# Patient Record
Sex: Female | Born: 2000 | Race: Black or African American | Hispanic: No | Marital: Single | Smoking: Never smoker
Health system: Southern US, Community
[De-identification: ages and names within clinical notes are randomized; demographics above are authoritative.]

## PROBLEM LIST (undated history)

## (undated) DIAGNOSIS — Z9109 Other allergy status, other than to drugs and biological substances: Secondary | ICD-10-CM

## (undated) DIAGNOSIS — T7840XA Allergy, unspecified, initial encounter: Secondary | ICD-10-CM

## (undated) HISTORY — DX: Allergy, unspecified, initial encounter: T78.40XA

---
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
## 2021-01-26 ENCOUNTER — Encounter (HOSPITAL_COMMUNITY): Payer: Self-pay | Admitting: Emergency Medicine

## 2021-01-26 ENCOUNTER — Other Ambulatory Visit: Payer: Self-pay

## 2021-01-26 ENCOUNTER — Emergency Department (HOSPITAL_COMMUNITY)
Admission: EM | Admit: 2021-01-26 | Discharge: 2021-01-26 | Disposition: A | Discharge status: Home / Self Care | Payer: Medicaid Other | Attending: Emergency Medicine | Admitting: Emergency Medicine

## 2021-01-26 DIAGNOSIS — S21102A Unspecified open wound of left front wall of thorax without penetration into thoracic cavity, initial encounter: Secondary | ICD-10-CM | POA: Diagnosis not present

## 2021-01-26 DIAGNOSIS — R030 Elevated blood-pressure reading, without diagnosis of hypertension: Secondary | ICD-10-CM | POA: Diagnosis not present

## 2021-01-26 DIAGNOSIS — X58XXXA Exposure to other specified factors, initial encounter: Secondary | ICD-10-CM | POA: Diagnosis not present

## 2021-01-26 DIAGNOSIS — R059 Cough, unspecified: Secondary | ICD-10-CM | POA: Insufficient documentation

## 2021-01-26 DIAGNOSIS — S299XXA Unspecified injury of thorax, initial encounter: Secondary | ICD-10-CM | POA: Diagnosis present

## 2021-01-26 DIAGNOSIS — R509 Fever, unspecified: Secondary | ICD-10-CM | POA: Diagnosis not present

## 2021-01-26 DIAGNOSIS — Z20822 Contact with and (suspected) exposure to covid-19: Secondary | ICD-10-CM | POA: Diagnosis not present

## 2021-01-26 DIAGNOSIS — Z8616 Personal history of COVID-19: Secondary | ICD-10-CM | POA: Insufficient documentation

## 2021-01-26 DIAGNOSIS — Z7722 Contact with and (suspected) exposure to environmental tobacco smoke (acute) (chronic): Secondary | ICD-10-CM | POA: Diagnosis not present

## 2021-01-26 LAB — BASIC METABOLIC PANEL
Anion gap: 6 (ref 5–15)
BUN: 7 mg/dL (ref 6–20)
CO2: 28 mmol/L (ref 22–32)
Calcium: 9.3 mg/dL (ref 8.9–10.3)
Chloride: 101 mmol/L (ref 98–111)
Creatinine, Ser: 0.78 mg/dL (ref 0.44–1.00)
GFR, Estimated: >60 mL/min (ref >60)
Glucose, Bld: 89 mg/dL (ref 70–99)
Potassium: 4 mmol/L (ref 3.5–5.1)
Sodium: 135 mmol/L (ref 135–145)

## 2021-01-26 LAB — CBC WITH DIFFERENTIAL/PLATELET
Abs Immature Granulocytes: 0.04 10*3/uL (ref 0.00–0.07)
Basophils Absolute: 0.1 10*3/uL (ref 0.0–0.1)
Basophils Relative: 1 %
Eosinophils Absolute: 0.3 10*3/uL (ref 0.0–0.5)
Eosinophils Relative: 2 %
HCT: 48.6 % — ABNORMAL HIGH (ref 36.0–46.0)
Hemoglobin: 14.8 g/dL (ref 12.0–15.0)
Immature Granulocytes: 0 %
Lymphocytes Relative: 27 %
Lymphs Abs: 3 10*3/uL (ref 0.7–4.0)
MCH: 24 pg — ABNORMAL LOW (ref 26.0–34.0)
MCHC: 30.5 g/dL (ref 30.0–36.0)
MCV: 78.8 fL — ABNORMAL LOW (ref 80.0–100.0)
Monocytes Absolute: 0.8 10*3/uL (ref 0.1–1.0)
Monocytes Relative: 7 %
Neutro Abs: 7.2 10*3/uL (ref 1.7–7.7)
Neutrophils Relative %: 63 %
Platelets: 380 10*3/uL (ref 150–400)
RBC: 6.17 MIL/uL — ABNORMAL HIGH (ref 3.87–5.11)
RDW: 16.3 % — ABNORMAL HIGH (ref 11.5–15.5)
WBC: 11.4 10*3/uL — ABNORMAL HIGH (ref 4.0–10.5)
nRBC: 0 % (ref 0.0–0.2)

## 2021-01-26 LAB — SARS CORONAVIRUS 2 (TAT 6-24 HRS): SARS Coronavirus 2: NEGATIVE

## 2021-01-26 MED ORDER — CEPHALEXIN 500 MG PO CAPS: 500.0000 mg | ORAL_CAPSULE | Freq: Four times a day (QID) | ORAL | 0 refills | Status: AC

## 2021-01-26 NOTE — ED Provider Notes (Signed)
[facility] EMERGENCY DEPARTMENT Provider Note   CSN: [id number] Arrival date & time: 01/26/21  1151     History Chief Complaint  Patient presents with   wound   Fever    Victoria Roy is a 20 y.o. adult with a past medical history of elevated BMI, prediabetes, hidradenitis suppurativa, who presents today for evaluation of a wound on the left side. They state that they noted a wound about 3 to 4 weeks ago on their left-sided chest.  They were seen in the emergency room on the second of this month and they were given a prescription for doxycycline which they state that they completed without any significant change. They do note that they went swimming during this in a chlorinated pool. They stated that they put grease on the area.  When I asked him what kind degrees they tell me it was hair grease.  The feel like the area is more painful and swollen than usual.   They also note that they had a known COVID exposure on Monday however they had been feeling poorly since since Sunday.  They deny any temps over 100.4 at home.  They have been coughing.  They are vaccinated against COVID and had COVID earlier this year.  They deny significant shortness of breath or chest pain aside from the wound.  They deny any abdominal pain, leg swelling, or rash.  When they were seen on 9/2 their blood pressure was noted to be significantly elevated in the 190s systolic.  They have not gotten any follow-up for this.  He states that at home on the check their blood pressure it is normal.   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 No pertinent surgical history.   OB History   No obstetric history on file.     Family History  Problem Relation Age of Onset   Heart attack Mother    Heart attack Maternal Grandmother    Breast cancer Maternal Grandmother     Social History   Tobacco Use   Smoking status: Never    Passive exposure: Yes   Smokeless tobacco: Never   Tobacco comments:    grandfather smokes outside  Vaping Use   Vaping Use: Every day  Substance Use Topics   Alcohol use: No   Drug use: Never    Home Medications Prior to Admission medications   Medication Sig Start Date End Date Taking? Authorizing Provider  cephALEXin (KEFLEX) 500 MG capsule Take 1 capsule (500 mg total) by mouth 4 (four) times daily for 10 days. 01/26/21 02/05/21 Yes Cristina Gong, PA-C    Allergies    Pollen extract and Soap  Review of Systems   Review of Systems  Constitutional:  Negative for chills and fever.  HENT:  Positive for congestion.   Respiratory:  Positive for cough. Negative for chest tightness.   Cardiovascular:  Negative for chest pain, palpitations and leg swelling.  Gastrointestinal:  Negative for abdominal pain, nausea and vomiting.  Musculoskeletal:  Negative for back pain and neck pain.  Skin:  Positive for wound.  Neurological:  Negative for dizziness, weakness and headaches.  Psychiatric/Behavioral:  Negative for confusion.   All other systems reviewed and are negative.  Physical Exam Updated Vital Signs BP (!) 151/94   Pulse 97   Temp 97.7 F (36.5 C)   Resp 18   SpO2 97%   Physical Exam Vitals and nursing note reviewed.  Constitutional:      General: Victoria Roy is not in acute distress.    Appearance: Victoria Roy is not ill-appearing.  HENT:     Head: Atraumatic.  Eyes:     Conjunctiva/sclera: Conjunctivae normal.  Cardiovascular:     Rate and Rhythm:  Normal rate.  Pulmonary:     Effort: Pulmonary effort is normal. No respiratory distress.  Abdominal:     General: There is no distension.  Musculoskeletal:     Cervical back: Normal range of motion and neck supple.     Comments: No obvious acute injury  Skin:    General: Skin is warm.     Comments: Please see clinical image.  On the left lateral chest wall at the base of pannus there is a small, approximately 1 cm wound.  There is no significant surrounding induration, fluctuance.  There is no surrounding crepitus with palpation.  Surrounding skin appears wet and macerated.  Neurological:     Mental Status: Victoria Roy is alert.     Comments: Awake and alert, answers all questions appropriately.  Speech is not slurred.  Psychiatric:        Mood and Affect: Mood normal.        Behavior: Behavior normal.     ED Results / Procedures / Treatments   Labs (all labs ordered are listed, but only abnormal results are displayed) Labs Reviewed  CBC WITH DIFFERENTIAL/PLATELET - Abnormal; Notable for the following components:      Result Value   WBC 11.4 (*)    RBC 6.17 (*)    HCT 48.6 (*)    MCV 78.8 (*)    MCH 24.0 (*)    RDW 16.3 (*)    All other components within normal limits  SARS CORONAVIRUS 2 (TAT 6-24 HRS)  BASIC METABOLIC PANEL    EKG None  Radiology No results found.  Procedures Procedures   Medications Ordered in ED Medications - No data to display  ED Course  I have reviewed the triage vital signs and the nursing notes.  Pertinent labs & imaging results that were available during my care of the patient were reviewed by me and considered in my medical decision making (see chart for details).    MDM Rules/Calculators/A&P                         Patient is an 20 year old adult who presents today for evaluation of a left-sided wound on the chest/abdomen.  There is no abnormal surrounding erythema, induration, or purulence.  They do report subjective  fevers however no measured fevers, and is afebrile here.  He did have a known COVID exposure which would explain the reported congestion, and cough.  They do not have any significant shortness of breath, chest pain, or abnormal leg swelling.  They report they are vaccinated against COVID.  Victoria Roy was evaluated in Emergency Department on 01/26/2021 for the symptoms described in the history of present illness. Victoria Roy was  evaluated in the context of the global COVID-19 pandemic, which necessitated consideration that the patient might be at risk for infection with the SARS-CoV-2 virus that causes COVID-19. Institutional protocols and algorithms that pertain to the evaluation of patients at risk for COVID-19 are in a state of rapid change based on information released by regulatory bodies including the CDC and federal and state organizations. These policies and algorithms were followed during the patient's care in the ED.   I suspect that given that they had a COVID exposure and now have COVID-like symptoms they are instructed that even if this test returns negative they need to quarantine and isolate appropriately.  Regarding their wound we discussed appropriate wound care.  They did swim in a pool with this open wound, and therefore we will give him Keflex for water exposure/Aeromonas coverage.  They do not appear to meet septic criteria at this time or have a significant infection.  Basic wound care is discussed.  Given that this wound has been present for multiple weeks referral for wound care is ordered.  They do have a history of prediabetes however their sugar is currently not significantly elevated.    Return precautions were discussed with patient who states their understanding.  At the time of discharge patient denied any unaddressed complaints or concerns.  Patient is agreeable for discharge home.  Note: Portions of this report may have been transcribed using voice  recognition software. Every effort was made to ensure accuracy; however, inadvertent computerized transcription errors may be present  Final Clinical Impression(s) / ED Diagnoses Final diagnoses:  Open wound of left chest wall, initial encounter  Suspected COVID-19 virus infection  Elevated blood pressure reading    Rx / DC Orders ED Discharge Orders          Ordered    Ambulatory referral to Wound Clinic        01/26/21 1728    cephALEXin (KEFLEX) 500 MG capsule  4 times daily        01/26/21 1748             Cristina Gong, PA-C 01/26/21 2341    Virgina Norfolk, DO 01/26/21 2343

## 2021-01-26 NOTE — Discharge Instructions (Addendum)
Given that you had a known COVID exposure and are now having symptoms even if your test today comes back negative you need to assume it is a false negative, that you have COVID, and quarantine appropriately. If you develop significant shortness of breath, severe chest pain, rashes, leg swelling, or have any other concerns please do not hesitate to seek additional medical care and evaluation. If your test is positive it appears that you would qualify for some of the oral antiviral medicines.  I have given you a prescription for antibiotics today.  This is specifically to treat any infection that you may have from the swimming.  Please do not swim, submerge the wound while bathing, as these things will make it infected. It is okay for you to shower, gently clean the area with gentle soap and allow water to run over the area rather than directly exposing it to the pressure from the shower. Please pat your wound dry.  If you are at home it is okay to leave the wound open and uncovered to allow it to air out.  Otherwise please keep it covered with the dressing.  If there is a lot of drainage then you may be better off taking a clean washcloth and putting it in the area and frequently changing that out to keep it from staying wet constantly.  I would recommend for the dressing that you, as we discussed, do a small square of the yellow gauze, followed by a patch of the nonstick gauze.  I have given you a referral to the wound clinic, please follow-up with them.  If you develop any new or concerning symptoms please seek additional medical care and evaluation.  As we discussed your blood pressure was significantly elevated today.  Please recheck this, preferably at home, in the next few weeks. Given that your blood pressure was high here I would recommend that if you need a cough or cold medicine you take those designed for someone with high blood pressure, such as Coricidin HBP.  You may have diarrhea  from the antibiotics.  It is very important that you continue to take the antibiotics even if you get diarrhea unless a medical professional tells you that you may stop taking them.  If you stop too early the bacteria you are being treated for will become stronger and you may need different, more powerful antibiotics that have more side effects and worsening diarrhea.  Please stay well hydrated and consider probiotics as they may decrease the severity of your diarrhea.  Please be aware that if you take any hormonal contraception (birth control pills, nexplanon, the ring, etc) that your birth control will not work while you are taking antibiotics and you need to use back up protection as directed on the birth control medication information insert.

## 2021-01-26 NOTE — ED Triage Notes (Signed)
Pt here for opened boil on L side of abdomen x 3 weeks, nasal drainage, fever, and non-productive cough x1 day. Pt say a PCP for boil and they prescribed doxycycline, pt finished antibiotics but boil is getting worse. Pt has swelling and open wound on abdomen.

## 2021-01-26 NOTE — ED Notes (Addendum)
Pt has two small wounds in the fold of left side mid abdomen. The biggest of the two wounds measures 0.05cmx0.08cm and the second 0.02cm The bedding is red tissue.

## 2021-01-26 NOTE — ED Provider Notes (Signed)
Emergency Medicine Provider Triage Evaluation Note  Victoria Roy , a 20 y.o. female  was evaluated in triage.  Pt complains of abscess underneath left breast that she noticed 3 to 4 weeks ago.  She reports she saw her PCP who started her on doxycycline.  She states she finished about 1 week ago without any improvement in symptoms.  She is also complaining of new onset cough and nasal congestion with positive recent sick contact with family member who is COVID-positive.  Patient is vaccinated.  States she has felt subjective fevers..  Review of Systems  Positive: + abscess, nasal congestion, cough, subjective fevers Negative: - SOB  Physical Exam  BP (!) 175/95 (BP Location: Left Arm)   Pulse 97   Temp 97.9 F (36.6 C) (Oral)   Resp 20   SpO2 98%  Gen:   Awake, no distress   Resp:  Normal effort  MSK:   Moves extremities without difficulty  Other:  Chaperone present for exam. Skin breakdown noted to left breast fold with serosanguinous drainage. No abscess appreciated at this time.   Medical Decision Making  Medically screening exam initiated at 1:27 PM.  Appropriate orders placed.  LEYAN BRANDEN was informed that the remainder of the evaluation will be completed by another provider, this initial triage assessment does not replace that evaluation, and the importance of remaining in the ED until their evaluation is complete.     Tanda Rockers, PA-C 01/26/21 1329    Tegeler, Canary Brim, MD 01/26/21 [phone]

## 2021-01-26 NOTE — ED Provider Notes (Signed)
I personally evaluated the patient during the encounter and completed a history, physical, procedures, medical decision making to contribute to the overall care of the patient and decision making for the patient briefly, the patient is a 20 y.o. adult here for reevaluation of left-sided wound/cellulitis.  Completed course antibiotics several days ago.  Patient has wound just under left breast tissue.  There is no obvious surrounding erythema or abscess or purulence.  There are some skin breakdown.  Overall there appears to be just some wound dehiscence from likely an old abscess site.  Some granulation tissue as well.  Have low suspicion for any deep space abscess.  Vital signs are unremarkable.  Lab work is unremarkable.  Mostly it sounds like patient has not been able to keep area clean and dry.  Has been using some topical ointments and going in pools that likely are not helping.  We will conservatively treat with another round antibiotics and topical Bactroban and given further wound care instructions.  Important to keep the area clean and dry as much as possible.  We will refer to wound clinic.  Discharged in good condition.  This chart was dictated using voice recognition software.  Despite best efforts to proofread,  errors can occur which can change the documentation meaning.    EKG Interpretation None            Virgina Norfolk, DO 01/26/21 1738

## 2021-01-31 ENCOUNTER — Encounter (HOSPITAL_BASED_OUTPATIENT_CLINIC_OR_DEPARTMENT_OTHER): Payer: Medicaid Other | Admitting: Internal Medicine

## 2021-02-14 ENCOUNTER — Encounter (HOSPITAL_BASED_OUTPATIENT_CLINIC_OR_DEPARTMENT_OTHER): Payer: Medicaid Other | Admitting: Internal Medicine

## 2021-07-27 ENCOUNTER — Encounter (HOSPITAL_BASED_OUTPATIENT_CLINIC_OR_DEPARTMENT_OTHER): Payer: Self-pay

## 2021-07-27 ENCOUNTER — Emergency Department (HOSPITAL_BASED_OUTPATIENT_CLINIC_OR_DEPARTMENT_OTHER): Payer: Medicaid Other

## 2021-07-27 ENCOUNTER — Other Ambulatory Visit: Payer: Self-pay

## 2021-07-27 ENCOUNTER — Emergency Department (HOSPITAL_BASED_OUTPATIENT_CLINIC_OR_DEPARTMENT_OTHER)
Admission: EM | Admit: 2021-07-27 | Discharge: 2021-07-27 | Disposition: A | Discharge status: Home / Self Care | Payer: Medicaid Other | Attending: Emergency Medicine | Admitting: Emergency Medicine

## 2021-07-27 DIAGNOSIS — H7091 Unspecified mastoiditis, right ear: Secondary | ICD-10-CM | POA: Insufficient documentation

## 2021-07-27 DIAGNOSIS — J029 Acute pharyngitis, unspecified: Secondary | ICD-10-CM | POA: Diagnosis present

## 2021-07-27 DIAGNOSIS — J020 Streptococcal pharyngitis: Secondary | ICD-10-CM | POA: Diagnosis not present

## 2021-07-27 LAB — CBC WITH DIFFERENTIAL/PLATELET
Abs Immature Granulocytes: 0.03 10*3/uL (ref 0.00–0.07)
Basophils Absolute: 0.1 10*3/uL (ref 0.0–0.1)
Basophils Relative: 1 %
Eosinophils Absolute: 0.4 10*3/uL (ref 0.0–0.5)
Eosinophils Relative: 4 %
HCT: 47.3 % — ABNORMAL HIGH (ref 36.0–46.0)
Hemoglobin: 14.7 g/dL (ref 12.0–15.0)
Immature Granulocytes: 0 %
Lymphocytes Relative: 11 %
Lymphs Abs: 1.2 10*3/uL (ref 0.7–4.0)
MCH: 23.9 pg — ABNORMAL LOW (ref 26.0–34.0)
MCHC: 31.1 g/dL (ref 30.0–36.0)
MCV: 76.8 fL — ABNORMAL LOW (ref 80.0–100.0)
Monocytes Absolute: 0.8 10*3/uL (ref 0.1–1.0)
Monocytes Relative: 8 %
Neutro Abs: 7.9 10*3/uL — ABNORMAL HIGH (ref 1.7–7.7)
Neutrophils Relative %: 76 %
Platelets: 330 10*3/uL (ref 150–400)
RBC: 6.16 MIL/uL — ABNORMAL HIGH (ref 3.87–5.11)
RDW: 17 % — ABNORMAL HIGH (ref 11.5–15.5)
WBC: 10.3 10*3/uL (ref 4.0–10.5)
nRBC: 0 % (ref 0.0–0.2)

## 2021-07-27 LAB — COMPREHENSIVE METABOLIC PANEL
ALT: 35 U/L (ref 0–44)
AST: 30 U/L (ref 15–41)
Albumin: 3.9 g/dL (ref 3.5–5.0)
Alkaline Phosphatase: 74 U/L (ref 38–126)
Anion gap: 6 (ref 5–15)
BUN: 6 mg/dL (ref 6–20)
CO2: 27 mmol/L (ref 22–32)
Calcium: 8.5 mg/dL — ABNORMAL LOW (ref 8.9–10.3)
Chloride: 100 mmol/L (ref 98–111)
Creatinine, Ser: 0.9 mg/dL (ref 0.44–1.00)
GFR, Estimated: >60 mL/min (ref >60)
Glucose, Bld: 92 mg/dL (ref 70–99)
Potassium: 3.5 mmol/L (ref 3.5–5.1)
Sodium: 133 mmol/L — ABNORMAL LOW (ref 135–145)
Total Bilirubin: 0.6 mg/dL (ref 0.3–1.2)
Total Protein: 8.1 g/dL (ref 6.5–8.1)

## 2021-07-27 LAB — GROUP A STREP BY PCR: Group A Strep by PCR: DETECTED — AB

## 2021-07-27 MED ORDER — AMOXICILLIN-POT CLAVULANATE 875-125 MG PO TABS: 1.0000 | ORAL_TABLET | Freq: Two times a day (BID) | ORAL | 0 refills | Status: AC

## 2021-07-27 MED ORDER — CLINDAMYCIN PHOSPHATE 600 MG/50ML IV SOLN
600.0000 mg | Freq: Once | INTRAVENOUS | Status: AC
  Administered 2021-07-27: 600 mg via INTRAVENOUS
  Filled 2021-07-27: qty 50

## 2021-07-27 MED ORDER — DEXAMETHASONE SODIUM PHOSPHATE 10 MG/ML IJ SOLN
10.0000 mg | Freq: Once | INTRAMUSCULAR | Status: AC
  Administered 2021-07-27: 10 mg via INTRAVENOUS
  Filled 2021-07-27: qty 1

## 2021-07-27 MED ORDER — IOHEXOL 300 MG/ML  SOLN
100.0000 mL | Freq: Once | INTRAMUSCULAR | Status: AC | PRN
  Administered 2021-07-27: 100 mL via INTRAVENOUS

## 2021-07-27 NOTE — ED Notes (Signed)
ED Provider at bedside. 

## 2021-07-27 NOTE — ED Provider Notes (Signed)
?[facility] EMERGENCY DEPARTMENT ?Provider Note ? ? ?CSN: [id number] ?Arrival date & time: 07/27/21  1843 ? ?  ? ?History ? ?Chief Complaint  ?Patient presents with  ? Sore Throat  ? ? ?Victoria Dingwallakorian O Roy is a 21 y.o. adult. ? ?21 y.o female with a PMH of recurrent tonsillitis presents to the ED with a chief complaint of sore throat x 3 days. She endorses constant swelling to her throat without any improvement in her symptoms.  She is able to tolerate liquids along with solids, last meal included yesterday consisting of [employer] chili.  She has taken [facility] powders without much improvement in symptoms.  Prior history of recurrent tonsillitis when she was told that she needed to have her tonsils removed however failed to do so due to insurance changes.  Did have a fever of 100 the day this first started however has not had any fever since.  She does have voice changes present.  No trismus, shortness of breath, chest pain. ? ?The history is provided by the patient and medical records.  ?Sore Throat ?Pertinent negatives include no chest pain and no shortness of breath.  ? ?  ? ?Home Medications ?Prior to Admission medications   ?Medication Sig Start Date End Date Taking? Authorizing Provider  ?amoxicillin-clavulanate (AUGMENTIN) 875-125 MG tablet Take 1 tablet by mouth every 12 (twelve) hours for 7 days. 07/27/21 08/03/21 Yes Claude MangesSoto, Yassmin Binegar, PA-C  ?   ? ?Allergies    ?Pollen extract and Soap   ? ?Review of Systems   ?Review of Systems  ?Constitutional:  Positive for fever. Negative for chills.  ?HENT:  Positive for sore throat and voice change. Negative for trouble swallowing.   ?Respiratory:  Negative for shortness of breath.   ?Cardiovascular:  Negative for chest pain.  ?All other systems reviewed and are negative. ? ?Physical Exam ?Updated Vital Signs ?BP 132/69 (BP Location: Left Arm)   Pulse 97   Temp 98.3 ?F (36.8 ?C) (Oral)   Resp 18   Ht 5\' 6"  (1.676 m)   Wt (!) 183.7 kg   SpO2 98%   BMI 65.37 kg/m?   ?Physical Exam ?Vitals and nursing note reviewed.  ?Constitutional:   ?   Appearance: Victoria Roy is well-developed. Victoria Roy is obese. Victoria Roy is not ill-appearing.  ?HENT:  ?   Head: Normocephalic and atraumatic.  ?   Mouth/Throat:  ?   Mouth: Mucous membranes are moist.  ?   Pharynx: Oropharynx is clear. Uvula midline. Posterior oropharyngeal erythema and uvula swelling present. No oropharyngeal exudate.  ?   Tonsils: Tonsillar abscess present. No tonsillar exudate. 3+ on the right. 3+ on the left.  ?   Comments: 3+ tonsils, symmetric without any tonsillar exudate noted.  Uvula is midline with some swelling noted.  No trismus ?Pulmonary:  ?   Effort: Pulmonary effort is normal.  ?   Breath sounds: No wheezing or rales.  ?Abdominal:  ?   Palpations: Abdomen is soft.  ?Neurological:  ?   Mental Status: Victoria Roy is alert and oriented to person, place, and time.  ? ? ?ED Results / Procedures / Treatments   ?Labs ?(all labs ordered are listed, but only abnormal results are displayed) ?Labs Reviewed  ?GROUP A STREP BY PCR - Abnormal; Notable for the following components:  ?    Result Value  ? Group A Strep by PCR DETECTED (*)   ? All other components within normal limits  ?CBC  WITH DIFFERENTIAL/PLATELET - Abnormal; Notable for the following components:  ? RBC 6.16 (*)   ? HCT 47.3 (*)   ? MCV 76.8 (*)   ? MCH 23.9 (*)   ? RDW 17.0 (*)   ? Neutro Abs 7.9 (*)   ? All other components within normal limits  ?COMPREHENSIVE METABOLIC PANEL - Abnormal; Notable for the following components:  ? Sodium 133 (*)   ? Calcium 8.5 (*)   ? All other components within normal limits  ? ? ?EKG ?None ? ?Radiology ?CT Soft Tissue Neck W Contrast ? ?Result Date: 07/27/2021 ?CLINICAL DATA:  Initial evaluation for acute sore throat. EXAM: CT NECK WITH CONTRAST TECHNIQUE: Multidetector CT imaging of the neck was performed using the standard protocol following the bolus administration of intravenous  contrast. RADIATION DOSE REDUCTION: This exam was performed according to the departmental dose-optimization program which includes automated exposure control, adjustment of the mA and/or kV according to patient size and/or use of iterative reconstruction technique. CONTRAST:  OMNIPAQUE IOHEXOL 300 MG/ML  SOLN COMPARISON:  None. FINDINGS: Pharynx and larynx: Oral cavity within normal limits. No acute inflammatory changes seen about the dentition. Palatine tonsils are enlarged and hypertrophied bilaterally, which could reflect changes of acute tonsillitis. Changes slightly more pronounced on the left. No discrete tonsillar or peritonsillar abscess. Adenoidal soft tissues prominent and hypertrophied as well. Remainder of the oropharynx and nasopharynx within normal limits. No retropharyngeal collection or swelling. Epiglottis itself within normal limits. Hypopharynx and supraglottic larynx demonstrates no other acute finding. Glottis grossly within normal limits. Subglottic airway is grossly clear. Salivary glands: Salivary glands including the parotid and submandibular glands are normal. Thyroid: Thyroid grossly within normal limits, although evaluation limited by habitus. Lymph nodes: Prominent bilateral level 2 lymph nodes measure up to 1.7 cm on the left and 2 cm on the right, nonspecific, but likely reactive. Additional shotty subcentimeter nodes noted elsewhere within the neck. Vascular: Grossly normal intravascular enhancement seen throughout the neck, although evaluation limited by habitus. Limited intracranial: Unremarkable. Visualized orbits: Unremarkable. Mastoids and visualized paranasal sinuses: Mild scattered mucosal thickening noted within the ethmoidal air cells and maxillary sinuses. Paranasal sinuses are otherwise clear. Small right mastoid effusion. Trace opacity noted within the left mastoid air cells. Middle ear cavities well pneumatized and free of fluid. Skeleton: No discrete or worrisome  osseous lesions. 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 Upper chest: Small pneumatocele noted at the right lung apex. Visualized upper chest demonstrates no acute finding. Other: None. IMPRESSION: 1. Enlarged and hypertrophied palatine tonsils and adenoidal soft tissues, which could reflect changes of acute tonsillitis. No discrete tonsillar or peritonsillar abscess. 2. Prominent bilateral level 2 lymph nodes, nonspecific, but likely reactive. 3. Small right mastoid effusion. Correlation with physical exam for possible concomitant mastoiditis recommended. Electronically Signed   By: Rise Mu M.D.   On: 07/27/2021 22:12   ? ?Procedures ?Procedures  ? ? ?Medications Ordered in ED ?Medications  ?clindamycin (CLEOCIN) IVPB 600 mg (0 mg Intravenous Stopped 07/27/21 2103)  ?dexamethasone (DECADRON) injection 10 mg (10 mg Intravenous Given 07/27/21 2041)  ?iohexol (OMNIPAQUE) 300 MG/ML solution 100 mL (100 mLs Intravenous Contrast Given 07/27/21 2144)  ? ? ?ED Course/ Medical Decision Making/ A&P ?Clinical Course as of 07/27/21 2247  ?Wed Jul 27, 2021  ?2236 Group A Strep by PCR(!): DETECTED [JS]  ?  ?Clinical Course User Index ?[JS] Claude Manges, PA-C  ? ?                        ?  Medical Decision Making ?Amount and/or Complexity of Data Reviewed ?Labs: ordered. ?Radiology: ordered. ? ?Risk ?Prescription drug management. ? ? ?This patient presents to the ED for concern of sore throat, this involves a number of treatment options, and is a complaint that carries with it a high risk of complications and morbidity.  The differential diagnosis includes peritonsillar abscess, strep pharyngitis versus Ludewig's angina. ? ? ?Co morbidities: ?Discussed in HPI ? ? ?Brief History: ? ?21 year old female presents to the ED with worsening sore throat that began a few days ago, no relief with any over-the-counter medication.  Last meal was yesterday consisting of [employer] chili.  Also reporting pain along the  right ear. ? ?EMR reviewed including pt PMHx, past surgical history and past visits to ER.  ? ?See HPI for more details ? ? ?Lab Tests: ? ?I ordered and independently interpreted labs.  The pertinent results include

## 2021-07-27 NOTE — ED Notes (Signed)
Attempted IV without success, have asked RT to obtain one via [place] ?

## 2021-07-27 NOTE — Discharge Instructions (Addendum)
I have prescribed antibiotic therapy to help treat your infection. ? ?You will need to take 1 tablet every 12 hours for the next 7 days.  In addition, you may take some Tylenol to help with any fever. ? ?If you experience worsening pain, worsening symptoms, or unable to tolerate your secretions you will need to return to the emergency department. ?

## 2021-07-27 NOTE — ED Triage Notes (Signed)
Pt c/o tonsil swelling x 3 days-NAD-steady gait ?

## 2021-07-27 NOTE — ED Notes (Signed)
Patient transported to CT 

## 2021-11-09 ENCOUNTER — Other Ambulatory Visit: Payer: Self-pay

## 2021-11-09 ENCOUNTER — Emergency Department (HOSPITAL_BASED_OUTPATIENT_CLINIC_OR_DEPARTMENT_OTHER): Payer: Medicaid Other

## 2021-11-09 ENCOUNTER — Emergency Department (HOSPITAL_BASED_OUTPATIENT_CLINIC_OR_DEPARTMENT_OTHER)
Admission: EM | Admit: 2021-11-09 | Discharge: 2021-11-09 | Disposition: A | Discharge status: Home / Self Care | Payer: Medicaid Other | Attending: Emergency Medicine | Admitting: Emergency Medicine

## 2021-11-09 ENCOUNTER — Encounter (HOSPITAL_BASED_OUTPATIENT_CLINIC_OR_DEPARTMENT_OTHER): Payer: Self-pay | Admitting: Emergency Medicine

## 2021-11-09 DIAGNOSIS — L732 Hidradenitis suppurativa: Secondary | ICD-10-CM | POA: Diagnosis not present

## 2021-11-09 DIAGNOSIS — R220 Localized swelling, mass and lump, head: Secondary | ICD-10-CM | POA: Diagnosis present

## 2021-11-09 DIAGNOSIS — M25572 Pain in left ankle and joints of left foot: Secondary | ICD-10-CM

## 2021-11-09 LAB — COMPREHENSIVE METABOLIC PANEL
ALT: 27 U/L (ref 0–44)
AST: 23 U/L (ref 15–41)
Albumin: 3.7 g/dL (ref 3.5–5.0)
Alkaline Phosphatase: 70 U/L (ref 38–126)
Anion gap: 7 (ref 5–15)
BUN: 8 mg/dL (ref 6–20)
CO2: 26 mmol/L (ref 22–32)
Calcium: 9.2 mg/dL (ref 8.9–10.3)
Chloride: 105 mmol/L (ref 98–111)
Creatinine, Ser: 0.97 mg/dL (ref 0.44–1.00)
GFR, Estimated: >60 mL/min (ref >60)
Glucose, Bld: 99 mg/dL (ref 70–99)
Potassium: 3.6 mmol/L (ref 3.5–5.1)
Sodium: 138 mmol/L (ref 135–145)
Total Bilirubin: 0.8 mg/dL (ref 0.3–1.2)
Total Protein: 7.8 g/dL (ref 6.5–8.1)

## 2021-11-09 LAB — CBC WITH DIFFERENTIAL/PLATELET
Abs Immature Granulocytes: 0.04 10*3/uL (ref 0.00–0.07)
Basophils Absolute: 0.1 10*3/uL (ref 0.0–0.1)
Basophils Relative: 0 %
Eosinophils Absolute: 0.3 10*3/uL (ref 0.0–0.5)
Eosinophils Relative: 2 %
HCT: 44.8 % (ref 36.0–46.0)
Hemoglobin: 13.7 g/dL (ref 12.0–15.0)
Immature Granulocytes: 0 %
Lymphocytes Relative: 23 %
Lymphs Abs: 2.9 10*3/uL (ref 0.7–4.0)
MCH: 23.7 pg — ABNORMAL LOW (ref 26.0–34.0)
MCHC: 30.6 g/dL (ref 30.0–36.0)
MCV: 77.6 fL — ABNORMAL LOW (ref 80.0–100.0)
Monocytes Absolute: 1.2 10*3/uL — ABNORMAL HIGH (ref 0.1–1.0)
Monocytes Relative: 9 %
Neutro Abs: 8.3 10*3/uL — ABNORMAL HIGH (ref 1.7–7.7)
Neutrophils Relative %: 66 %
Platelets: 345 10*3/uL (ref 150–400)
RBC: 5.77 MIL/uL — ABNORMAL HIGH (ref 3.87–5.11)
RDW: 16.5 % — ABNORMAL HIGH (ref 11.5–15.5)
WBC: 12.8 10*3/uL — ABNORMAL HIGH (ref 4.0–10.5)
nRBC: 0 % (ref 0.0–0.2)

## 2021-11-09 LAB — BRAIN NATRIURETIC PEPTIDE: B Natriuretic Peptide: 12 pg/mL (ref 0.0–100.0)

## 2021-11-09 MED ORDER — ACETAMINOPHEN 325 MG PO TABS
650.0000 mg | ORAL_TABLET | Freq: Once | ORAL | Status: AC
  Administered 2021-11-09: 650 mg via ORAL
  Filled 2021-11-09: qty 2

## 2021-11-09 NOTE — ED Notes (Signed)
Written and verbal inst to pt  Verbalized an understanding  To home with family  

## 2021-11-09 NOTE — Discharge Instructions (Addendum)
Call your primary care doctor or specialist as discussed in the next 2-3 days.   Return immediately back to the ER if:  Your symptoms worsen within the next 12-24 hours. You develop new symptoms such as new fevers, persistent vomiting, new pain, shortness of breath, or new weakness or numbness, or if you have any other concerns.  

## 2021-11-09 NOTE — ED Triage Notes (Signed)
Pt arrives in wc with multiple complaints.   Left ankle swelling since yesterday - denies injury  Sleep apnea Left eye drainage due to seasonal allergies Abscess under right arm and on back x 4 days.

## 2021-11-09 NOTE — ED Notes (Signed)
ED Provider at bedside. 

## 2021-11-13 NOTE — ED Provider Notes (Signed)
[facility] EMERGENCY DEPARTMENT Provider Note   CSN: [id number] Arrival date & time: 11/09/21  1647     History  Chief Complaint  Patient presents with   Abscess    Right Armpit   Leg Swelling   Eye Problem    Victoria Roy is a 21 y.o. adult.  Presents with her family over with multiple concerns, but ultimately states that she feels fine and was told to come here by her family.  Triage notation mentions abscesses and leg swelling and eye problems.  However the patient states that she was sent by her grandmother to be evaluated for asthma.  She otherwise denies shortness of breath denies fevers cough vomiting or diarrhea.  Has no pain or discomfort reported to me at this time.       Home Medications Prior to Admission medications   Not on File      Allergies    Pollen extract and Soap    Review of Systems   Review of Systems  Constitutional:  Negative for fever.  HENT:  Negative for ear pain and sore throat.   Eyes:  Negative for pain.  Respiratory:  Negative for cough.   Cardiovascular:  Negative for chest pain.  Gastrointestinal:  Negative for abdominal pain.  Genitourinary:  Negative for flank pain.  Musculoskeletal:  Negative for back pain.  Skin:  Negative for color change and rash.  Neurological:  Negative for syncope.  All other systems reviewed and are negative.   Physical Exam Updated Vital Signs BP (!) 143/115 (BP Location: Right Arm)   Pulse 98   Temp 97.6 F (36.4 C) (Oral)   Resp 18   Wt (S) (!) 184 kg   SpO2 98%   BMI 65.47 kg/m  Physical Exam Constitutional:      Appearance: Victoria Roy is well-developed.  HENT:     Head: Normocephalic.     Nose: Nose normal.  Eyes:     Extraocular Movements: Extraocular movements intact.  Cardiovascular:     Rate and Rhythm: Normal rate.  Pulmonary:     Effort: Pulmonary effort is normal. No respiratory distress.     Breath sounds: No stridor. No wheezing, rhonchi or  rales.  Chest:     Chest wall: No tenderness.  Skin:    Coloration: Skin is not jaundiced.  Neurological:     Mental Status: Victoria Roy is alert. Mental status is at baseline.     ED Results / Procedures / Treatments   Labs (all labs ordered are listed, but only abnormal results are displayed) Labs Reviewed  CBC WITH DIFFERENTIAL/PLATELET - Abnormal; Notable for the following components:      Result Value   WBC 12.8 (*)    RBC 5.77 (*)    MCV 77.6 (*)    MCH 23.7 (*)    RDW 16.5 (*)    Neutro Abs 8.3 (*)    Monocytes Absolute 1.2 (*)    All other components within normal limits  COMPREHENSIVE METABOLIC PANEL  BRAIN NATRIURETIC PEPTIDE    EKG EKG Interpretation  Date/Time:  Wednesday November 09 2021 17:01:45 EDT Ventricular Rate:  84 PR Interval:  160 QRS Duration: 78 QT Interval:  330 QTC Calculation: 389 R Axis:   50 Text Interpretation: Normal sinus rhythm Normal ECG No previous ECGs available Confirmed by Norman Clay ([id number]) on 11/09/2021 8:29:41 PM  Radiology No results found.  Procedures Procedures    Medications Ordered in ED Medications  acetaminophen (TYLENOL) tablet 650 mg (650 mg Oral Given 11/09/21 2052)    ED Course/ Medical Decision Making/ A&P                           Medical Decision Making Amount and/or Complexity of Data Reviewed Labs: ordered. Radiology: ordered.  Risk OTC drugs.   Patient is comfortable appearing.  Lung sounds are clear.  History from family member at bedside obtained.  Cardiac monitoring showing sinus rhythm.  Patient without complaints states that they are just here because their grandmother told him to come to be checked out for asthma.  She has a history of hidradenitis suppurativa but no new lesions.  Does not wish for any other evaluation.  Lung sounds were clear and I recommend outpatient follow-up with her doctor in 2 to 3 days and immediate return for worsening symptoms or additional  concerns.        Final Clinical Impression(s) / ED Diagnoses Final diagnoses:  Hidradenitis suppurativa    Rx / DC Orders ED Discharge Orders     None         Cheryll Cockayne, MD 11/13/21 2321

## 2022-06-21 ENCOUNTER — Emergency Department (HOSPITAL_BASED_OUTPATIENT_CLINIC_OR_DEPARTMENT_OTHER)
Admission: EM | Admit: 2022-06-21 | Discharge: 2022-06-21 | Disposition: A | Discharge status: Home / Self Care | Payer: Medicaid Other | Attending: Emergency Medicine | Admitting: Emergency Medicine

## 2022-06-21 ENCOUNTER — Encounter (HOSPITAL_BASED_OUTPATIENT_CLINIC_OR_DEPARTMENT_OTHER): Payer: Self-pay | Admitting: Pediatrics

## 2022-06-21 ENCOUNTER — Other Ambulatory Visit: Payer: Self-pay

## 2022-06-21 DIAGNOSIS — Z9889 Other specified postprocedural states: Secondary | ICD-10-CM | POA: Insufficient documentation

## 2022-06-21 DIAGNOSIS — T8131XA Disruption of external operation (surgical) wound, not elsewhere classified, initial encounter: Secondary | ICD-10-CM | POA: Diagnosis not present

## 2022-06-21 DIAGNOSIS — Z5189 Encounter for other specified aftercare: Secondary | ICD-10-CM

## 2022-06-21 MED ORDER — LIDOCAINE-EPINEPHRINE (PF) 2 %-1:200000 IJ SOLN
INTRAMUSCULAR | Status: AC
  Filled 2022-06-21: qty 20

## 2022-06-21 NOTE — Discharge Instructions (Addendum)
Please follow-up with your PCP or surgeon for further instruction and recommendations on wound packing. If you develop fever, chills, rash please return to the ED.  You will need to follow-up with your surgeon tomorrow.

## 2022-06-21 NOTE — ED Notes (Signed)
Upon attempting to DC pt, EDP re-packed wound.  Upon assessment, lg amt of blood noted.  EDP made aware.  Re-dressed area and will re-check in 30 min

## 2022-06-21 NOTE — ED Provider Notes (Addendum)
Victoria Roy Provider Note   CSN: [id number] Arrival date & time: 06/21/22  1508     History  Chief Complaint  Patient presents with   Wound Check    Victoria Roy is a 22 y.o. adult, history of pilonidal cyst, who presents to the ED secondary to bleeding surgical wound, has been going on for the past day.  They state that they had their surgery last Thursday for a pilonidal cyst removal, and attempted to unpack it themselves today, ripped the bandaging out, and started having bleeding of the surgical site.  States that she has been taking showers with the packing in, and letting the water dripped over it, and has had no issues until today when she tried to unpack it dry, and she started having bleeding.  Denies any redness, swelling of the area.  States that she needs help with packing the area.     Home Medications Prior to Admission medications   Not on File      Allergies    Pollen extract and Soap    Review of Systems   Review of Systems  Skin:  Positive for color change and wound.    Physical Exam Updated Vital Signs BP (!) 150/83 (BP Location: Left Arm)   Pulse (!) 112   Temp 98.6 F (37 C) (Oral)   Resp 18   Ht 5' 6"$  (1.676 m)   Wt (!) 181.4 kg   SpO2 96%   BMI 64.56 kg/m  Physical Exam Vitals and nursing note reviewed.  Constitutional:      General: SABRINIA Roy is not in acute distress.    Appearance: Victoria Roy is well-developed.  HENT:     Head: Normocephalic and atraumatic.  Eyes:     Conjunctiva/sclera: Conjunctivae normal.  Cardiovascular:     Rate and Rhythm: Normal rate and regular rhythm.     Heart sounds: No murmur heard. Pulmonary:     Effort: Pulmonary effort is normal. No respiratory distress.     Breath sounds: Normal breath sounds.  Abdominal:     Palpations: Abdomen is soft.     Tenderness: There is no abdominal tenderness.  Musculoskeletal:        General: No  swelling.     Cervical back: Neck supple.  Skin:    General: Skin is warm.     Capillary Refill: Capillary refill takes less than 2 seconds.     Comments: 2x2 cm surgical wound along intergluteal cleft bleeding. No erythema, warmth, or fluctuance. No purulent drainage.  Neurological:     Mental Status: Romona Roy is alert.  Psychiatric:        Mood and Affect: Mood normal.     ED Results / Procedures / Treatments   Labs (all labs ordered are listed, but only abnormal results are displayed) Labs Reviewed - No data to display   EKG None  Radiology No results found.  Procedures Wound packing  Date/Time: 06/21/2022 4:59 PM  Performed by: Osvaldo Shipper, PA Authorized by: Osvaldo Shipper, PA  Consent: Verbal consent obtained. Written consent not obtained. Patient understanding: patient states understanding of the procedure being performed Patient identity confirmed: verbally with patient Time out: Immediately prior to procedure a "time out" was called to verify the correct patient, procedure, equipment, support staff and site/side marked as required. Preparation: Patient was prepped and draped in the usual sterile fashion. Local anesthesia used: no  Anesthesia: Local  anesthesia used: no  Sedation: Patient sedated: no  Patient tolerance: patient tolerated the procedure well with no immediate complications       Medications Ordered in ED Medications  lidocaine-EPINEPHrine (XYLOCAINE W/EPI) 2 %-1:200000 (PF) injection (has no administration in time range)    ED Course/ Medical Decision Making/ A&P Clinical Course as of 06/21/22 1904  Wed Jun 21, 2022  1901 Extensive pilonidal abscess wound.  Having bleeding after packing.  I injected lidocaine with hemostasis achieved. Pain is under control.  I repacked the wound personally and recommended the patient follow-up with the surgeon within 48 hours. [CC]    Clinical Course User Index [CC] Tretha Sciara,  MD   {                           Medical Decision Making Patient is 22 year old female, here for bleeding wound, states she had surgery done on Thursday, and has remove the packing today, and has a bleeding of her wound.  Has not attempted to pack, states that they did not instruct her how to pack.  Wound is bleeding, and, likely secondary from trauma, packed with iodine gauze, after packing, bleeding continued, attempted to pack with combat gauze, good hemostasis, but with removal rebleed. Spoke with Dr. Oswald Hillock, he evaluated patient, and performed procedure w/lidocaine and epinephrine and was able to achieve hemostasis. Repacked wound. Instructed to f/u with surgery in AM.   Amount and/or Complexity of Data Reviewed Labs: ordered.  pression(s) / ED Diagnoses Final diagnoses:  Visit for wound check    Rx / DC Orders ED Discharge Orders     None         Zacari Stiff, Si Gaul, PA 06/21/22 1700    Denvil Canning, Si Gaul, [place] 06/21/22 1904    Tretha Sciara, MD 06/21/22 2330

## 2022-06-21 NOTE — ED Notes (Signed)
PA and MD at bedside, MD attempting to stitch area.  Packing re-applied and covered with a dsg.

## 2022-06-21 NOTE — ED Notes (Signed)
Pt had Pilonidal cyst surgically removed last Friday.  Pt came in for wound re-check and excessive bleeding

## 2022-06-21 NOTE — ED Triage Notes (Signed)
Reports s/p pilonidal cyst sx last Friday. Started having some bleeding this morning.

## 2022-06-21 NOTE — ED Notes (Signed)
Pt given paper pants to wear home

## 2022-06-21 NOTE — ED Notes (Signed)
Wound continues to bleed, combat dsg applied by EDP will re-assess in 30 min

## 2022-06-25 NOTE — ED Provider Notes (Signed)
 ------------------------------------------------------------------------------- Attestation signed by Seaback, [place] Causey, MD at 06/26/2022  4:53 PM I was the attending physician on duty at the time the patient visited the ED.  The patient was evaluated by the PA/NP.  I was personally available for consultation, however, I did not see the patient nor participate in the medical decision making of the encounter unless otherwise documented.  I am administratively signing the chart. -------------------------------------------------------------------------------  Patient placed in First Look pathway, seen and evaluated for chief complaint of fever & sore throat x 2 days. Pertinent HPI findings include obesity, asthma, at bedtime, prediabetes, GAD. Pertinent exam findings include non-toxic in appearance, heart with regular rate and rhythm, distal pulses intact and symmetric, speech is clear, no strength deficit and ambulates with a steady gait. Based on initial evaluation, labs are currently indicated and radiology studies are not currently indicated as allowed for current processes and treatments as applicable in a triage setting and could be different than if patient were seen in a main treatment area or dependent on labs/imagining after results are displayed.  Patient counseled on process, plan, and necessity for staying for completing the evaluation.   This document serves as a record of services personally performed by Berkeley Nest FNP-C. 11:18 PM   [facility] Emergency Department Emergency Department Provider Note  This document was created using the aid of voice recognition Dragon dictation software.   Provider at bedside: 06/25/2022 11:16 PM  History obtained from the: Patient  History  No chief complaint on file.   HPI  Victoria Roy is a 22 y.o. female who presents to the ED with complaints of subjective fever and congestion for the last 2 days.  Family  members with RSV and is just here to get checked out.  Patient denies headache, sore throat, chest pain, shortness of breath, N/V/D, abdominal pain, changes in GU or GI.   11:16 PM Previous medical records reviewed from [facility] Care Everywhere and EPIC Chart Review.   No LMP for female patient.  Past Medical History Past Medical History:  Diagnosis Date  . Acanthosis nigricans 01/01/2020  . Allergy   . Asthma    as a baby; no problems since age 88  . Atopic dermatitis 10/27/2015  . BMI (body mass index), pediatric, > 99% for age 87/23/2013  . 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 COVID-19 05/20/2020  . Eczema   . GAD (generalized anxiety disorder) 06/30/2020  . Gender dysphoria in adult 06/30/2020  . Hidradenitis suppurativa   . 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 Precocious puberty    benign premature adrenarche  . Prediabetes 01/01/2020  . 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 Severe obesity due to excess calories with body mass index (BMI) greater than 99th percentile for age in pediatric patient (HCC) 08/22/2011  . Snores     Past Surgical History Past Surgical History:  Procedure Laterality Date  . PILONIDAL CYST - CLEFT LIFT EXCISION N/A 06/16/2022   Procedure: OPEN PILONIDAL CYST EXCISION;  Surgeon: Teresa Prentice Blush, MD;  Location: [facility] MAIN OR;  Service: General;  Laterality: N/A;  . R wrist wound closure  Right     Medications These were reviewed. See nursing note for details.  Allergies Pollen extracts and Soap  Family History Family History  Problem Relation Age of Onset  . Diabetes Mother   . Hypertension Mother   . Hyperlipidemia Mother   . Obesity Mother   . Heart disease Maternal Grandmother  first MI age 45  . Hyperlipidemia Maternal Grandmother   . Diabetes Maternal Grandmother   . Hypertension Maternal Grandmother   . Obesity Maternal Grandmother        gastric bypass 1980s  . Sleep apnea Maternal Grandmother   . Thyroid  disease Maternal Grandmother   . Hypertension Maternal Grandfather   . Diabetes Maternal Uncle   . Blood Disorder Neg Hx   . Clotting disorder Neg Hx   . Anesthesia problems Neg Hx     Social History Social History   Tobacco Use  . Smoking status: Never    Passive exposure: Yes  . Smokeless tobacco: Never  Vaping Use  . Vaping Use: Never used  Substance Use Topics  . Alcohol use: No  . Drug use: No    All family history, social history and PMH were reviewed by myself. See nursing note for details.   Review of Systems  Review of Systems  Constitutional: Positive for fever.  HENT: Positive for congestion.   Eyes: Negative.  Negative for photophobia.  Respiratory: Negative.  Negative for chest tightness and shortness of breath.   Cardiovascular: Negative.  Negative for chest pain.  Gastrointestinal: Negative.  Negative for abdominal pain.  Endocrine: Negative.   Genitourinary: Negative.  Negative for dysuria.  Skin: Negative.  Negative for color change.  Allergic/Immunologic: Negative.   Neurological: Negative.  Negative for dizziness and speech difficulty.  Hematological: Negative.   Psychiatric/Behavioral: Negative.   All other systems reviewed and are negative.   Physical Exam   Vitals:   06/25/22 2058 06/25/22 2106  BP: (!) 168/112   Pulse: 95   Temp: 97.5 F (36.4 C)   Resp: 16   SpO2: 95%   PainSc:  5-Five (moderate)    BP (!) 168/112   Pulse 95   Temp 97.5 F (36.4 C) (Oral)   Resp 16   Ht 1.676 m (5' 6)   Wt (!) 181.4 kg (400 lb)   SpO2 95%   BMI 64.56 kg/m   Physical Exam Vitals and nursing note reviewed.  Constitutional:      General: He is not in acute distress.    Appearance: Normal appearance. He is not ill-appearing.  HENT:     Head: Normocephalic and atraumatic.     Right Ear: External ear normal.     Left Ear: External ear normal.     Nose: Congestion present.     Mouth/Throat:     Mouth: Mucous membranes are moist.      Pharynx: Oropharynx is clear. No oropharyngeal exudate or posterior oropharyngeal erythema.  Eyes:     Extraocular Movements: Extraocular movements intact.     Pupils: Pupils are equal, round, and reactive to light.  Cardiovascular:     Rate and Rhythm: Normal rate and regular rhythm.     Pulses: Normal pulses.     Heart sounds: Normal heart sounds.  Pulmonary:     Effort: Pulmonary effort is normal.     Breath sounds: Normal breath sounds.  Abdominal:     Palpations: Abdomen is soft.  Musculoskeletal:        General: Normal range of motion.     Cervical back: Normal range of motion.  Skin:    General: Skin is warm and dry.     Capillary Refill: Capillary refill takes less than 2 seconds.  Neurological:     General: No focal deficit present.     Mental Status: He is alert and oriented to  person, place, and time. Mental status is at baseline.     GCS: GCS eye subscore is 4. GCS verbal subscore is 5. GCS motor subscore is 6.  Psychiatric:        Behavior: Behavior normal.        Thought Content: Thought content normal.     Results  LABS  Labs Reviewed  COVID-19/INFLUENZA A/B/RSV PCR (CEPHEID)    Recent Results (from the past 72 hour(s))  COVID-19/Influenza A/B/RSV PCR (Cepheid)   Specimen: Nasal; Nasopharyngeal Swab  Result Value Ref Range   SARS-COV-2 Negative Negative   SARS-COV-2 COMMENT      Positive results are indicative of active infection with SARS-CoV-2; clinical correlation with patient history and other diagnostic information is necessary to determine patient infection status.  Negative results do not preclude SARS-CoV-2 infection and should not be used as the sole basis for treatment or other patient management decisions.   SARS-COV-2 METHOD      This test was performed on the Cepheid GeneXpert instrument at [facility].  The Xpress SARS-CoV-2 test is only for use under the Food and Drug Administration's Emergency Use Authorization.   Influenza A Negative  Negative   Influenza B Negative Negative   RSV Negative Negative    Radiology  No results found for any visits on 06/25/22.  EKG   ED Course     On my initial exam, the pt was awake, alert, oriented and in no acute distress.    Family/facility contacted: patient and sibling  Discussed options for workup with patient including risks and benefits via shared decision making and decided to proceed with workup.  Differential diagnosis considered includes: Ddx: viral URI, bacterial URI, bronchitis, sinusitis, seasonal allergies, pharyngitis, influenza, COVID-19  Labs/Imaging/medications/other testing ordered due to concerns discussed in history of and physical exam include: covid/flu  ER provider interpretation of Labs:  Lab work reviewed shows resp panel is (-)  I reviewed medical record from 06/06/2022 which showed patient was seen by general surgery for pilonidal cyst of natal cleft.  Clinical Complexity Number and complexity of problems addressed: Patient's presentation is most consistent with acute complicated illness / injury requiring diagnostic workup..  Risk of complications and/or morbidity or mortality of patient management: Patient's impaired access to primary care increases the complexity of managing their  presentation with URI sx.   Medications Given in Emergency Department  Medications - No data to display  Procedure Note  Procedures  Medical Decision Making  Medical Decision Making 11:20 PM symptom management follow-up care discussed with patient who verbalized understanding and is agreeable to plan.  Elevated blood pressure reading however other vital signs within normal limits.  Clinically stable for discharge at this time.  Strict return precautions were discussed.  Acute URI: acute illness or injury Amount and/or Complexity of Data Reviewed External Data Reviewed: notes. Labs: ordered. Decision-making details documented in ED Course.   Risk OTC  drugs.        Evidence based calculators if applicable:    (A HEAR Score of 0-3 is Low, a Score of 4-8 is High)  ED Clinical Impression   1. Acute URI     Clinical picture was discussed with patient along with risks and benefits of management options, shared decision making we will discharge the patient home with close outpatient follow-up for continued management.  Patient's health illiteracy and lack of access to primary care increases the complexity of managing their presentation and will complicate their treatment plans. I have addressed  this by provided information on how to obtain primary care provider and/or appropriate outpatient discharge .   Based on the above findings, I believe patient is hemodynamically stable for discharge.  Decision to admit or increased level of care requiring transfer: No: Evaluation and diagnostic workup in ED does not suggest an emergent condition requiring admission or immediate observation beyond what has been performed at this time. The patient is safe for discharge and has been instructed to return for worsening symptoms, change in symptoms, or any other concerns.  Provider time spent in patient care today, inclusive of but not limited to clinical reassessment, review of diagnostic studies, and discharge preparation, was greater than 30 minutes.   The following prescriptions were given for continued management of symptoms or pathologies: none  Patient and/or family educated about specific return precautions for given chief complaint and symptoms.  Patient and/or family educated about follow-up with PCP.  Patient and/or family expressed understanding of return precautions and need for follow-up.    Patient discharged. Diagnosis, treatment, plan discussed with patient.  All questions were answered to the patient's satisfaction. If patient was given medication(s), adverse effects, black box warnings, and drug interactions were discussed with patient.   Patient instructed to follow-up here in the emergency department for new or worsening symptoms.       1. Acute URI      Medication List    ASK your doctor about these medications   fluticasone propionate 50 mcg/actuation nasal spray Commonly known as: FLONASE Use 1 spray in each nostril daily.      FOLLOW UP Physician and [facility] [place]  [id number] [phone]    Emergency - [facility], [facility] [address] [place]   [address] [phone]    _____________________________       Electronically signed by: Berkeley Nest, NP 06/25/22 2321    Electronically signed by: Seaback, [place] Causey, MD 06/26/22 [phone]

## 2023-01-31 ENCOUNTER — Emergency Department (HOSPITAL_BASED_OUTPATIENT_CLINIC_OR_DEPARTMENT_OTHER)
Admission: EM | Admit: 2023-01-31 | Discharge: 2023-01-31 | Disposition: A | Discharge status: Home / Self Care | Payer: Medicaid Other | Attending: Emergency Medicine | Admitting: Emergency Medicine

## 2023-01-31 ENCOUNTER — Other Ambulatory Visit: Payer: Self-pay

## 2023-01-31 DIAGNOSIS — L732 Hidradenitis suppurativa: Secondary | ICD-10-CM | POA: Diagnosis present

## 2023-01-31 NOTE — ED Provider Notes (Signed)
[place] [facility] AT [facility] Provider Note   CSN: [id number] Arrival date & time: 01/31/23  1046     History  Chief Complaint  Patient presents with   Abscess    Victoria Roy is a 22 y.o. adult.  22 year old female transgender female to female patient with a history of hidradenitis suppurativa who presents to the emergency department with axillary pain and swelling.  Patient reports that on Saturday Victoria Roy started experiencing swelling of Victoria Roy right axilla.  Was placed on doxycycline by Victoria Roy which Victoria Roy reports starting to take yesterday.  Says that the pain persisted and it is interfering with Victoria Roy ability to work so Victoria Roy wanted to come to the emergency department to be evaluated.  No fevers or chills.  Has had them surgically drained in the past.       Home Medications Prior to Admission medications   Not on File      Allergies    Pollen extract and Soap    Review of Systems   Review of Systems  Physical Exam Updated Vital Signs BP 137/79 (BP Location: Right Arm)   Pulse 90   Temp 97.9 F (36.6 C)   Resp 16   Ht 5\' 6"  (1.676 m)   Wt (!) 168.7 kg   SpO2 98%   BMI 60.04 kg/m  Physical Exam Vitals and nursing note reviewed.  Constitutional:      General: Victoria Roy is not in acute distress.    Appearance: Victoria Roy is well-developed.  HENT:     Head: Normocephalic and atraumatic.     Right Ear: External ear normal.     Left Ear: External ear normal.     Nose: Nose normal.  Eyes:     Extraocular Movements: Extraocular movements intact.     Conjunctiva/sclera: Conjunctivae normal.     Pupils: Pupils are equal, round, and reactive to light.  Musculoskeletal:     Cervical back: Normal range of motion and neck supple.     Right lower leg: No edema.     Left lower leg: No edema.     Comments: Axillary lymphadenopathy on the right.  No overlying erythema or warmth.  No fluctuance palpated.  Skin:    General: Skin is warm and  dry.  Neurological:     Mental Status: Victoria Roy is alert. Mental status is at baseline.  Psychiatric:        Mood and Affect: Mood normal.        Behavior: Behavior normal.     ED Results / Procedures / Treatments   Labs (all labs ordered are listed, but only abnormal results are displayed) Labs Reviewed - No data to display  EKG None  Radiology No results found.  Procedures Procedures   EMERGENCY DEPARTMENT US SOFT TISSUE INTERPRETATION "Study: Limited Soft Tissue Ultrasound"  INDICATIONS: Pain Multiple views of the body part were obtained in real-time with a multi-frequency linear probe  PERFORMED BY: Myself IMAGES ARCHIVED?: No SIDE:Right  BODY PART:Axilla INTERPRETATION:  No abcess noted    Medications Ordered in ED Medications - No data to display  ED Course/ Medical Decision Making/ A&P                                 Medical Decision Making  Victoria Roy is a 22 y.o. adult with comorbidities that complicate the patient evaluation including hidradenitis suppurativa who presents emergency department  with right axilla pain  Initial Ddx:  Reactive lymphadenopathy, hidradenitis, cyst, abscess  MDM/Course:  Patient presents with several days of right axilla pain.  Currently is on doxycycline.  No systemic symptoms of infection now.  On exam no fluctuance was palpated.  Out of an abundance of caution did perform an ultrasound which confirmed that there was no abscess present.  Will have the patient continue doxycycline for now and follow-up with Victoria Roy in several days for a wound check to make sure that an abscess does not develop.  This patient presents to the ED for concern of complaints listed in HPI, this involves an extensive number of treatment options, and is a complaint that carries with it a high risk of complications and morbidity. Disposition including potential need for admission considered.   Dispo: DC Home. Return precautions  discussed including, but not limited to, those listed in the AVS. Allowed pt time to ask questions which were answered fully prior to dc.  Additional history obtained from family Records reviewed Outpatient Clinic Notes I have reviewed the patients home medications and made adjustments as needed Social Determinants of health:  Transgender patient  Portions of this note were generated with Scientist, clinical (histocompatibility and immunogenetics). Dictation errors may occur despite best attempts at proofreading.           Final Clinical Impression(s) / ED Diagnoses Final diagnoses:  Hidradenitis axillaris    Rx / DC Orders ED Discharge Orders     None         Rondel Baton, MD 01/31/23 1123

## 2023-01-31 NOTE — ED Notes (Signed)
Reviewed discharge instructions and recommendations. Pt states understanding

## 2023-01-31 NOTE — Discharge Instructions (Signed)
Continue taking your doxycycline and follow-up with your primary doctor in several days.  Return to the emergency department if the cyst gets larger, you develop fevers, or any other concerning symptoms.

## 2023-01-31 NOTE — ED Triage Notes (Signed)
C/O abscess on mid chest and right underarm. Stated was seen by PCP last Friday and was started on Doxycycline.

## 2023-01-31 NOTE — ED Notes (Signed)
Pt. Has noted R axillary abscess that is not draining or ready to drain.  She also has an abscess in the middle of her breast small that is not draining... EDP did an [place] on her Abscess areas.  Pt. In no distress and understands the areas are not for I&D.

## 2023-05-18 IMAGING — CT CT NECK W/ CM
3 of 8 series · 9 of 33 positions shown, 10 images · IV contrast (Omnipaque)
Comparison: None.

CLINICAL DATA: Initial evaluation for acute sore throat.

EXAM:
CT NECK WITH CONTRAST
TECHNIQUE: Multidetector CT imaging of the neck was performed using the
standard protocol following the bolus administration of intravenous
contrast.

[Series 3: axial neck · axial · 0.76mm/px · z∈[-60,+102]mm · 4 of 137 slices shown, 5 images]
[im 28/137  soft-tissue]
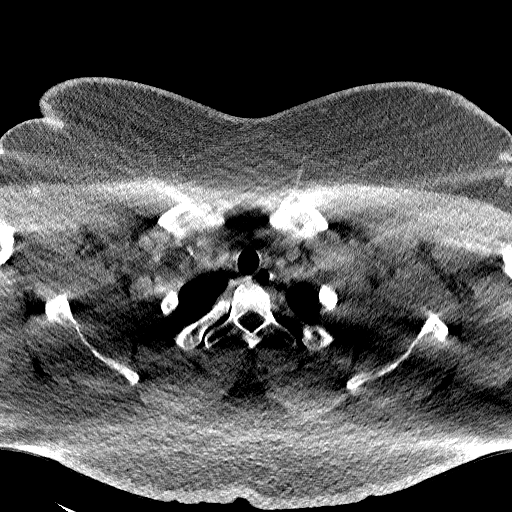
[im 28/137  bone]
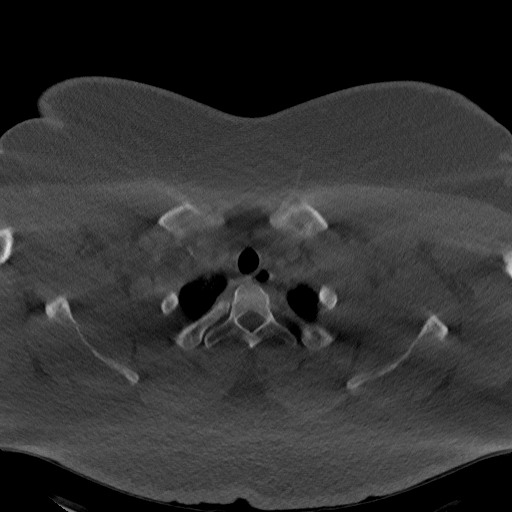
[im 55/137  bone]
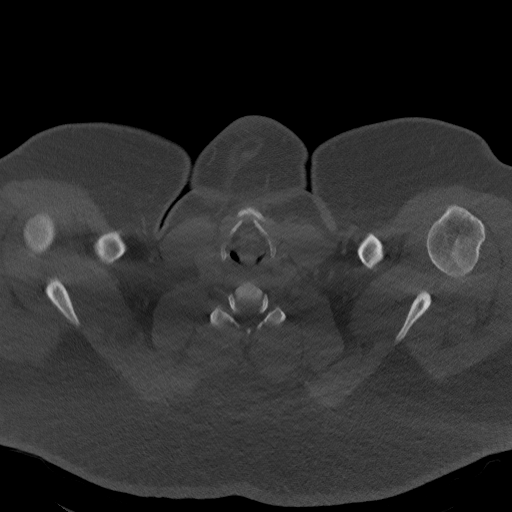
[im 82/137  bone]
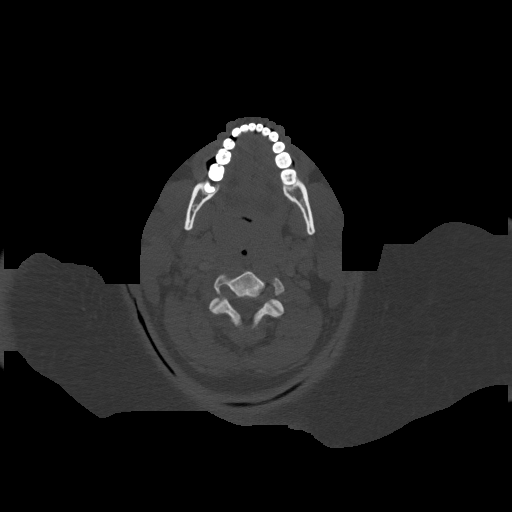
[im 109/137  bone]
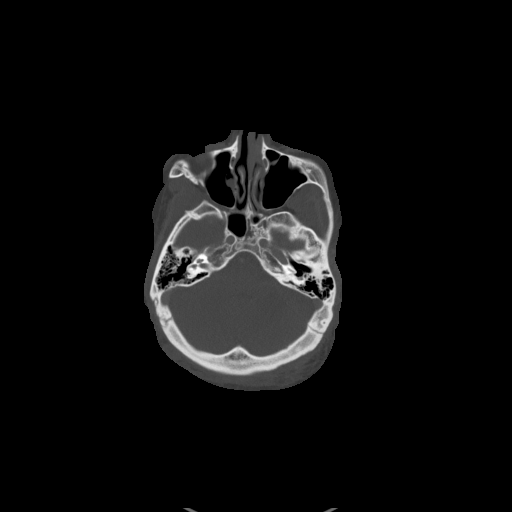

[Series 10: cor neck · coronal · 0.53mm/px · 1 of 112 slices shown]
[im 56/112  bone]
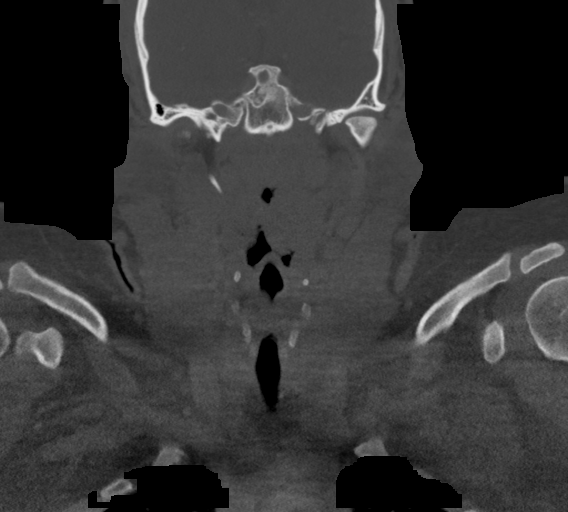

[Series 11: ax oropharynx · axial · 0.50mm/px · z∈[-59,+103]mm · 4 of 135 slices shown]
[im 27/135  bone]
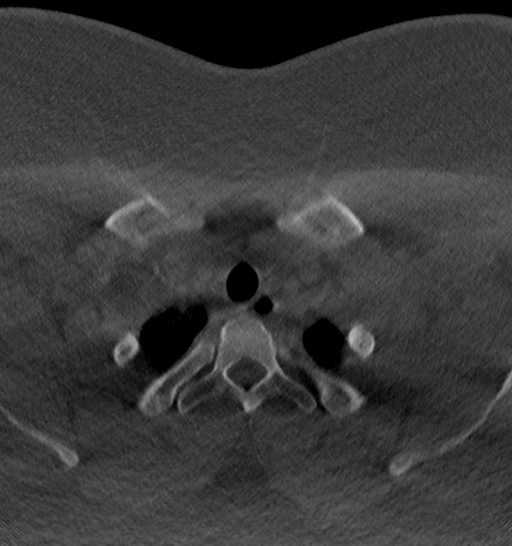
[im 54/135  bone]
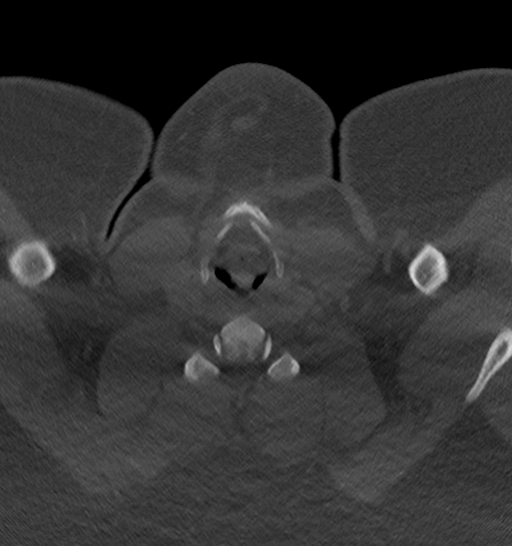
[im 81/135  bone]
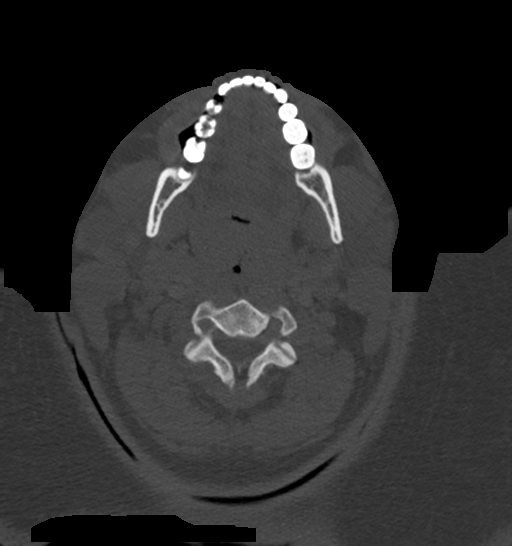
[im 108/135  bone]
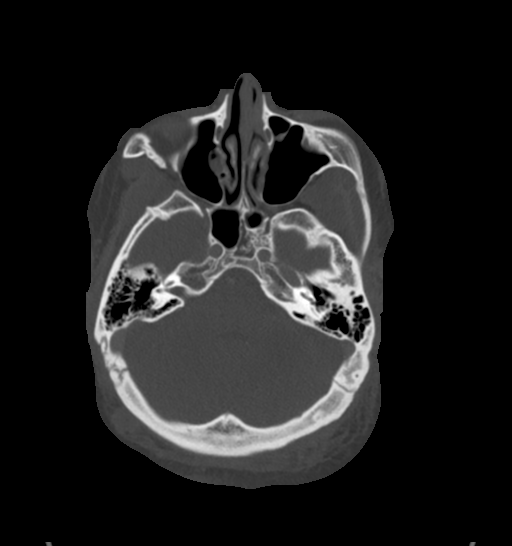

[9 of 33 positions shown; findings below may reference images not displayed]

RADIATION DOSE REDUCTION: This exam was performed according to the
departmental dose-optimization program which includes automated
exposure control, adjustment of the mA and/or kV according to
patient size and/or use of iterative reconstruction technique.

CONTRAST:  100mL OMNIPAQUE IOHEXOL 300 MG/ML  SOLN
FINDINGS: Pharynx and larynx: Oral cavity within normal limits. No acute
inflammatory changes seen about the dentition. Palatine tonsils are
enlarged and hypertrophied bilaterally, which could reflect changes
of acute tonsillitis. Changes slightly more pronounced on the left.
No discrete tonsillar or peritonsillar abscess. Adenoidal soft
tissues prominent and hypertrophied as well. Remainder of the
oropharynx and nasopharynx within normal limits. No retropharyngeal
collection or swelling. Epiglottis itself within normal limits.
Hypopharynx and supraglottic larynx demonstrates no other acute
finding. Glottis grossly within normal limits. Subglottic airway is
grossly clear.

Salivary glands: Salivary glands including the parotid and
submandibular glands are normal.

Thyroid: Thyroid grossly within normal limits, although evaluation
limited by habitus.

Lymph nodes: Prominent bilateral level 2 lymph nodes measure up to
1.7 cm on the left and 2 cm on the right, nonspecific, but likely
reactive. Additional shotty subcentimeter nodes noted elsewhere
within the neck.

Vascular: Grossly normal intravascular enhancement seen throughout
the neck, although evaluation limited by habitus.

Limited intracranial: Unremarkable.

Visualized orbits: Unremarkable.

Mastoids and visualized paranasal sinuses: Mild scattered mucosal
thickening noted within the ethmoidal air cells and maxillary
sinuses. Paranasal sinuses are otherwise clear. Small right mastoid
effusion. Trace opacity noted within the left mastoid air cells.
Middle ear cavities well pneumatized and free of fluid.

Skeleton: No discrete or worrisome osseous lesions. Congenital
fusion of the C6 and C7 vertebral bodies noted.

Upper chest: Small pneumatocele noted at the right lung apex.
Visualized upper chest demonstrates no acute finding.

Other: None.
IMPRESSION: 1. Enlarged and hypertrophied palatine tonsils and adenoidal soft
tissues, which could reflect changes of acute tonsillitis. No
discrete tonsillar or peritonsillar abscess.
2. Prominent bilateral level 2 lymph nodes, nonspecific, but likely
reactive.
3. Small right mastoid effusion. Correlation with physical exam for
possible concomitant mastoiditis recommended.

## 2023-09-16 NOTE — ED Provider Notes (Signed)
 [facility] Emergency Department Emergency Department Provider Note  This document was created using the aid of voice recognition Dragon dictation software.   Provider at bedside: 09/16/2023 5:51 PM  History obtained from the: Patient and Mother &/or Father  History   Chief Complaint  Patient presents with  . Shortness of Breath  . Chest Pain    HPI  Victoria Roy is a 23 y.o. female who presents to the ED with complaints of chest pain and shortness of breath.  Per patient, symptoms originally started last night with his chest feeling funny .  Patient states he elected to stay up all night and throughout the morning as he had some personal things to attend to.  However, around noon today patient started to fall asleep again and felt the funny nests return with some minor shortness of breath.  Patient has been prescribed a CPAP machine but does not have the mask and states he has not been wearing it for the last several months.  Patient states that shortness of breath persisted prompting him to want to come to the ER.  Patient states chest pain has resolved and shortness of breath is not currently present.  Denies syncope, palpitations, fever, chills, lightheadedness and dizziness.  ______________________ ROS: Pertinent positives and negatives per HPI. Pertinent past medical, surgical, social and family history records were reviewed. Current Medications and Allergies were reviewed.  Physical Exam   Vitals:   09/16/23 1532 09/16/23 1542 09/16/23 1700  BP: (!) 159/98  (!) 165/92  BP Location: Right arm  Right arm  Patient Position: Sitting  Lying  Pulse: 92  88  Resp:  18 16  Temp: 98.9 F (37.2 C)    TempSrc: Oral    SpO2: 96%  93%  Weight: (!) 190 kg (418 lb 6.4 oz)    Height: 167.6 cm (5' 6)      Physical Exam Vitals and nursing note reviewed.  Constitutional:      General: He is not in acute distress.    Appearance: He is obese. He is not  ill-appearing, toxic-appearing or diaphoretic.  HENT:     Head: Normocephalic and atraumatic.  Cardiovascular:     Rate and Rhythm: Normal rate and regular rhythm.     Heart sounds: Normal heart sounds. Heart sounds not distant. No murmur heard. Pulmonary:     Effort: Pulmonary effort is normal. No tachypnea, accessory muscle usage or respiratory distress.     Breath sounds: Normal breath sounds. No decreased breath sounds, wheezing, rhonchi or rales.  Chest:     Chest wall: Tenderness present. No mass, deformity, crepitus or edema. There is no dullness to percussion.       Comments: Small palpable area of tenderness which patient states has been draining lately.  No drainage present at this time.  No clear definable abscess.  No overlying skin changes.  No induration or fluctuance.  Most consistent with lymph node Abdominal:     Palpations: Abdomen is soft. There is no fluid wave or mass.     Tenderness: There is no abdominal tenderness. There is no guarding or rebound.  Musculoskeletal:     Cervical back: Normal range of motion and neck supple.  Skin:    General: Skin is warm and dry.  Neurological:     General: No focal deficit present.     Mental Status: He is alert.     Results  EKG Impression:  (Interpreted by me)  No STEMI,  no QT prolongation or heart block.  No depressions or elevations  ED Course     Medical Decision Making  Pt is a 23 y.o. female who presented with chest pain and shortness of breath now resolved.   DDX: MI, pneumonia, pneumothorax, sleep apnea, cellulitis  Clinical Complexity  Patient's presentation is most consistent with acute presentation with potential threat to life or bodily function.  Patient's Impaired access to primary care increases the complexity of managing their  presentation with chest pain and shortness of breath.    Provider time spent in patient care today, inclusive of but not limited to clinical reassessment, review of  diagnostic studies, and discharge preparation, was greater than 30 minutes.   Afebrile nontoxic-appearing 23 year old female presenting with chest pain and shortness of breath.  Reassessed throughout ER stay and remained stable.  No recurrence of symptoms.  However, during ED course, patient did fall asleep at 1 point and desatted into the high 80s on room air.  Patient is supposed to be wearing a CPAP machine for sleep however has not been recently using it and notes symptoms tend to come on prior to/during sleep including chest pain shortness of breath.  Patient denies any symptoms at this time however I do feel symptoms may be related to not wearing his CPAP machine.  Troponins flat.  EKG reassuring.  Chest x-ray negative for any acute findings.  Reassessed throughout ER stay and remained stable.  Mild leukocytosis of 12 but otherwise reassuring workup with negative for acute findings or emergent pathology requiring admission at this time.  I had a long discussion with both the patient and his mother about wearing his CPAP machine consistently.  Patient states the reason he has not is that he is missing a mask.  I discussed this with our respiratory therapist team however they are unable to provide a mask at this time.  However, they did recommend patient should call the provider and/or company who prescribed his CPAP machine and they should be able to fax over his information to get a mask as they should already have his measurements.  This was discussed with the patient.  Patient is aware of who provided the machine and is able to call them upon discharge today.  I have stressed the importance of getting this done immediately to ensure patient is CPAP compliant going forward which is important for both acute and long-term health.  Patient plans to follow-up with them later today after discharge or call them first thing tomorrow morning.  I feel this plan is reasonable with her reassuring workup to further  indicate patient did not require emergent management or treatment at this time.  Admission unlikely to yield benefit and patient will follow-up tonight or tomorrow morning with his outpatient provider for CPAP machine.  I did give strict return precautions for any new or worsening symptoms.  Patient remained asymptomatic throughout ED course.  If symptoms progress or worsen patient understands to return to the ER at any time for further workup and evaluation as needed.  All Labs were nterpreted/reviewed and overall reassuring with the exception of: Troponin nonelevated.  EKG unremarkable.  Chest x-ray negative for acute findings.  Patient reassessed throughout ER stay and remained stable.  Results were discussed with the patient.  We also discussed plan for treatment going forward and importance of close follow-up with her outpatient provider.  I did give strict return precautions for any new symptoms or any worsening symptoms.  Patient verbalized understanding  and agreement to plan, treatment, and close follow-up with return precautions for any new or worsening symptoms.    Medical Decision Making Problems Addressed: Chest pain, unspecified type: complicated acute illness or injury Sleep apnea, unspecified type: complicated acute illness or injury  Amount and/or Complexity of Data Reviewed Labs: ordered. Radiology: ordered. ECG/medicine tests: ordered.    ED Clinical Impression   1. Chest pain, unspecified type   2. Sleep apnea, unspecified type    FOLLOW UP Arnulfo Jenna Sharps, MD [address] [place] [address] [phone]  Schedule an appointment as soon as possible for a visit    [facility] -  EMERGENCY DEPARTMENT [address] [place]  [address] [phone]  As needed, If symptoms worsen   ED Disposition     ED Disposition  Discharge   Condition  Stable   Comment  --         _____________________________

## 2023-09-17 NOTE — Progress Notes (Signed)
 Situation: Member not enrolled into care coordination navigation services.   Background:  Member identified from 09/16/23 ED visit for chest pain/sleep apnea.   Assessment: Ambulatory Care Navigator made attempt to contact member to follow up and engage in care management services. ACN left a message with contact information.   Recommendation: Ambulatory Care Navigator will contact member at a later time.   Schuyler Benders BSN, RN, CCM  Ambulatory Care Navigator Population Health

## 2023-09-18 NOTE — Progress Notes (Addendum)
 Situation: Member not enrolled in care management services.   Background: Member identified from 09/16/23 ED visit for chest pain/sleep apnea.   Assessment: ED protocol complete. Member states he is doing well and has made a follow up with his PC at [facility]. He has no other needs at this time.  Member declined care management.  No immediate needs/concerns noted.   Recommendation:  If further needs arise, new referral can be sent for Care Management.    Schuyler Benders BSN, RN, CCM  Ambulatory Care Navigator [employer] [phone]

## 2024-02-02 NOTE — ED Provider Notes (Signed)
 [facility] Emergency Department Emergency Department Provider Note  This document was created using the aid of voice recognition Dragon dictation software.   Provider at bedside: 02/02/2024 7:19 AM  History obtained from the: Patient  History   Chief Complaint  Patient presents with  . Rash  . Allergic Reaction    HPI  Victoria Roy is a 23 y.o. female who presents to the ED with complaints of urticaric rash.  Patient states they ate shellfish and multiple other things last night and then woke up with urticarial rash to the limbs.  No shortness of breath no throat irritation.  No other complaints unsure of any allergies.  ______________________ ROS: Pertinent positives and negatives per HPI.  Physical Exam   Vitals:   02/02/24 0638  BP: (!) 157/95  BP Location: Right arm  Patient Position: Sitting  Pulse: 100  Resp: 15  Temp: 98.8 F (37.1 C)  TempSrc: Oral  SpO2: 96%  Weight: (!) 195 kg (429 lb 3.2 oz)  Height: 167.6 cm (5' 6)    Physical Exam Constitutional:      General: He is not in acute distress.    Appearance: Normal appearance. He is not ill-appearing.  Cardiovascular:     Rate and Rhythm: Normal rate and regular rhythm.  Pulmonary:     Effort: Pulmonary effort is normal.  Skin:    General: Skin is warm and dry.     Findings: Rash present.     Comments: Mild urticarial-like rash  Neurological:     Mental Status: He is alert and oriented to person, place, and time.     ED Course     Medical Decision Making  Initial Intervention:   DDX: Urticaria, other allergic reaction, contact dermatitis, viral exanthem  Clinical Complexity     Patient's presentation is most consistent with acute presentation with potential threat to life or bodily function.  Patient's morbid obesity increases the complexity of managing their  presentation with rash.    Provider time spent in patient care today, inclusive of but not limited  to clinical reassessment, review of diagnostic studies, and discharge preparation, was greater than 30 minutes.   All Imaging and lab work personally viewed and interpreted by myself. My interpretations are as follow:  Presents with a rash.  Very mild urticaric rash.  Will treat with Benadryl and antihistamines.  Otherwise no throat involvement.  Not anaphylaxis.  Could be food allergy based on what the patient said.  Will refer to allergy testing.  Otherwise discharge home return precaution.  Patient agreeable to plan.  Medical Decision Making   ED Clinical Impression   1. Rash    FOLLOW UP [facility] -  EMERGENCY DEPARTMENT [address] [place]  [address] [phone]    [facility]  - [place] [address] [place]  [address] [phone]     ED Disposition     ED Disposition  Discharge   Condition  Stable   Comment  --        _____________________________

## 2024-02-02 NOTE — ED Triage Notes (Signed)
 Pt. Reports eating seafood last night and also had some grapes/strawberries, woke up this morning with itchy urticarial rash.  Pt. Has not taken anything for it, is also unsure as to what may be causing symptoms

## 2024-02-03 ENCOUNTER — Emergency Department (HOSPITAL_BASED_OUTPATIENT_CLINIC_OR_DEPARTMENT_OTHER)
Admission: EM | Admit: 2024-02-03 | Discharge: 2024-02-04 | Disposition: A | Discharge status: Home / Self Care | Attending: Emergency Medicine | Admitting: Emergency Medicine

## 2024-02-03 ENCOUNTER — Other Ambulatory Visit: Payer: Self-pay

## 2024-02-03 ENCOUNTER — Encounter (HOSPITAL_BASED_OUTPATIENT_CLINIC_OR_DEPARTMENT_OTHER): Payer: Self-pay | Admitting: Emergency Medicine

## 2024-02-03 DIAGNOSIS — D72829 Elevated white blood cell count, unspecified: Secondary | ICD-10-CM | POA: Insufficient documentation

## 2024-02-03 DIAGNOSIS — N390 Urinary tract infection, site not specified: Secondary | ICD-10-CM | POA: Diagnosis not present

## 2024-02-03 DIAGNOSIS — R509 Fever, unspecified: Secondary | ICD-10-CM | POA: Diagnosis present

## 2024-02-03 LAB — CBC WITH DIFFERENTIAL/PLATELET
Abs Immature Granulocytes: 0.05 K/uL (ref 0.00–0.07)
Basophils Absolute: 0 K/uL (ref 0.0–0.1)
Basophils Relative: 0 %
Eosinophils Absolute: 0.1 K/uL (ref 0.0–0.5)
Eosinophils Relative: 1 %
HCT: 45.5 % (ref 36.0–46.0)
Hemoglobin: 14.2 g/dL (ref 12.0–15.0)
Immature Granulocytes: 0 %
Lymphocytes Relative: 20 %
Lymphs Abs: 3 K/uL (ref 0.7–4.0)
MCH: 23.7 pg — ABNORMAL LOW (ref 26.0–34.0)
MCHC: 31.2 g/dL (ref 30.0–36.0)
MCV: 76 fL — ABNORMAL LOW (ref 80.0–100.0)
Monocytes Absolute: 1.2 K/uL — ABNORMAL HIGH (ref 0.1–1.0)
Monocytes Relative: 8 %
Neutro Abs: 11 K/uL — ABNORMAL HIGH (ref 1.7–7.7)
Neutrophils Relative %: 71 %
Platelets: 365 K/uL (ref 150–400)
RBC: 5.99 MIL/uL — ABNORMAL HIGH (ref 3.87–5.11)
RDW: 15.9 % — ABNORMAL HIGH (ref 11.5–15.5)
WBC: 15.5 K/uL — ABNORMAL HIGH (ref 4.0–10.5)
nRBC: 0 % (ref 0.0–0.2)

## 2024-02-03 LAB — LACTIC ACID, PLASMA: Lactic Acid, Venous: 2.7 mmol/L (ref 0.5–1.9)

## 2024-02-03 LAB — COMPREHENSIVE METABOLIC PANEL WITH GFR
ALT: 31 U/L (ref 0–44)
AST: 28 U/L (ref 15–41)
Albumin: 4.4 g/dL (ref 3.5–5.0)
Alkaline Phosphatase: 75 U/L (ref 38–126)
Anion gap: 14 (ref 5–15)
BUN: 8 mg/dL (ref 6–20)
CO2: 24 mmol/L (ref 22–32)
Calcium: 9.4 mg/dL (ref 8.9–10.3)
Chloride: 100 mmol/L (ref 98–111)
Creatinine, Ser: 1.04 mg/dL — ABNORMAL HIGH (ref 0.44–1.00)
GFR, Estimated: >60 mL/min (ref >60)
Glucose, Bld: 87 mg/dL (ref 70–99)
Potassium: 3.9 mmol/L (ref 3.5–5.1)
Sodium: 138 mmol/L (ref 135–145)
Total Bilirubin: 0.9 mg/dL (ref 0.0–1.2)
Total Protein: 8.5 g/dL — ABNORMAL HIGH (ref 6.5–8.1)

## 2024-02-03 LAB — PROTIME-INR
INR: 1.1 (ref 0.8–1.2)
Prothrombin Time: 15.1 s (ref 11.4–15.2)

## 2024-02-03 MED ORDER — SODIUM CHLORIDE 0.9 % IV BOLUS
1000.0000 mL | Freq: Once | INTRAVENOUS | Status: AC
  Administered 2024-02-04: 1000 mL via INTRAVENOUS

## 2024-02-03 MED ORDER — ACETAMINOPHEN 500 MG PO TABS
1000.0000 mg | ORAL_TABLET | Freq: Once | ORAL | Status: AC
  Administered 2024-02-03: 1000 mg via ORAL
  Filled 2024-02-03: qty 2

## 2024-02-03 MED ORDER — SODIUM CHLORIDE 0.9 % IV SOLN
2.0000 g | Freq: Once | INTRAVENOUS | Status: AC
  Administered 2024-02-04: 2 g via INTRAVENOUS
  Filled 2024-02-03: qty 20

## 2024-02-03 NOTE — ED Triage Notes (Signed)
 Pt reports rash that is persistent since last night; they were sen for same at another ED; reports rash has not improved; NAD, no oral swelling at this time; fever noted, pt sts currently taking abx for multiple abscesses

## 2024-02-04 LAB — URINALYSIS, W/ REFLEX TO CULTURE (INFECTION SUSPECTED)
Bilirubin Urine: NEGATIVE
Glucose, UA: NEGATIVE mg/dL
Hgb urine dipstick: NEGATIVE
Ketones, ur: NEGATIVE mg/dL
Leukocytes,Ua: NEGATIVE
Nitrite: POSITIVE — AB
Protein, ur: 30 mg/dL — AB
Specific Gravity, Urine: 1.025 (ref 1.005–1.030)
pH: 6 (ref 5.0–8.0)

## 2024-02-04 LAB — LACTIC ACID, PLASMA: Lactic Acid, Venous: 1.6 mmol/L (ref 0.5–1.9)

## 2024-02-04 LAB — PRO BRAIN NATRIURETIC PEPTIDE: Pro Brain Natriuretic Peptide: <50 pg/mL (ref <300.0)

## 2024-02-04 MED ORDER — LACTATED RINGERS IV BOLUS
1000.0000 mL | Freq: Once | INTRAVENOUS | Status: AC
  Administered 2024-02-04: 1000 mL via INTRAVENOUS

## 2024-02-04 MED ORDER — HYDROXYZINE HCL 10 MG PO TABS
10.0000 mg | ORAL_TABLET | Freq: Once | ORAL | Status: AC
Start: 2024-02-04 — End: 2024-02-04
  Administered 2024-02-04: 10 mg via ORAL
  Filled 2024-02-04: qty 1

## 2024-02-04 MED ORDER — CEPHALEXIN 500 MG PO CAPS: 500.0000 mg | ORAL_CAPSULE | Freq: Four times a day (QID) | ORAL | 0 refills | Status: AC

## 2024-02-04 MED ORDER — CEPHALEXIN 500 MG PO CAPS
500.0000 mg | ORAL_CAPSULE | Freq: Four times a day (QID) | ORAL | 0 refills | Status: DC
Start: 2024-02-04 — End: 2024-02-04

## 2024-02-04 NOTE — ED Notes (Signed)
 EDP notified of lactic acid 2.7. Orders to follow.

## 2024-02-04 NOTE — ED Provider Notes (Signed)
 [place] [facility] Provider Note   CSN: [id number] Arrival date & time: 02/03/24  2214     Patient presents with: Rash   Victoria Roy is a 23 y.o. adult.   The patient presents with swelling of the hands and feet, accompanied by pain and itching, following a suspected allergic reaction. The symptoms began after consuming seafood and strawberries, with the patient expressing concern about a potential fruit allergy, as her sister recently discovered a similar allergy. The patient reports taking Benadryl, which alleviated a rash and chest pain experienced during a prior hospital visit but did not reduce the swelling. The patient denies any previous episodes of swelling in the hands and feet. She experienced a fever earlier today, which fluctuated but was present upon arrival. The patient is currently on a three-month course of Minocycline for hidradenitis suppurativa, with lesions primarily in the armpits and thighs. She denies any gastrointestinal symptoms such as diarrhea, constipation, or vomiting. The history was obtained from the patient.   Rash      Prior to Admission medications   Medication Sig Start Date End Date Taking? Authorizing Provider  cephALEXin  (KEFLEX ) 500 MG capsule Take 1 capsule (500 mg total) by mouth 4 (four) times daily. 02/04/24   Chong January, Selinda, MD    Allergies: Pollen extract and Soap    Review of Systems  Skin:  Positive for rash.    Updated Vital Signs BP 127/60   Pulse 92   Temp 98.2 F (36.8 C) (Oral)   Resp 20   Ht 5' 6 (1.676 m)   Wt (!) 194.6 kg   SpO2 98%   BMI 69.24 kg/m   Physical Exam Vitals and nursing note reviewed.  Constitutional:      Appearance: Victoria Roy is well-developed.  HENT:     Head: Normocephalic and atraumatic.  Cardiovascular:     Rate and Rhythm: Normal rate.  Pulmonary:     Effort: Pulmonary effort is normal. No respiratory distress.  Abdominal:     General: There  is no distension.  Musculoskeletal:        General: Normal range of motion.     Cervical back: Normal range of motion.     Comments: Difficult to assess for any edema secondary to body habitus but nothing pitting.  Neurological:     Mental Status: Victoria Roy is alert.     (all labs ordered are listed, but only abnormal results are displayed) Labs Reviewed  COMPREHENSIVE METABOLIC PANEL WITH GFR - Abnormal; Notable for the following components:      Result Value   Creatinine, Ser 1.04 (*)    Total Protein 8.5 (*)    All other components within normal limits  LACTIC ACID, PLASMA - Abnormal; Notable for the following components:   Lactic Acid, Venous 2.7 (*)    All other components within normal limits  CBC WITH DIFFERENTIAL/PLATELET - Abnormal; Notable for the following components:   WBC 15.5 (*)    RBC 5.99 (*)    MCV 76.0 (*)    MCH 23.7 (*)    RDW 15.9 (*)    Neutro Abs 11.0 (*)    Monocytes Absolute 1.2 (*)    All other components within normal limits  URINALYSIS, W/ REFLEX TO CULTURE (INFECTION SUSPECTED) - Abnormal; Notable for the following components:   Protein, ur 30 (*)    Nitrite POSITIVE (*)    Bacteria, UA RARE (*)    All other components within normal  limits  CULTURE, BLOOD (ROUTINE X 2)  CULTURE, BLOOD (ROUTINE X 2)  URINE CULTURE  LACTIC ACID, PLASMA  PROTIME-INR  PRO BRAIN NATRIURETIC PEPTIDE    EKG: None  Radiology: No results found.   Procedures   Medications Ordered in the ED  acetaminophen  (TYLENOL ) tablet 1,000 mg (1,000 mg Oral Given 02/03/24 2230)  cefTRIAXone (ROCEPHIN) 2 g in sodium chloride 0.9 % 100 mL IVPB (0 g Intravenous Stopped 02/04/24 0105)  sodium chloride 0.9 % bolus 1,000 mL (0 mLs Intravenous Stopped 02/04/24 0105)  hydrOXYzine (ATARAX) tablet 10 mg (10 mg Oral Given 02/04/24 0156)  lactated ringers bolus 1,000 mL (0 mLs Intravenous Stopped 02/04/24 0256)                                    Medical Decision Making Amount  and/or Complexity of Data Reviewed Labs: ordered.  Risk OTC drugs. Prescription drug management.   Patient initially with SIRS criteria so treated aggressively with fluids and antibiotics.  Overall patient improved and feels much better.  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  Will treat her UTI.  Patient was observed for couple hours and persistently normal vital signs and persistently feeling better so felt to be stable for discharge.  Strict return precautions.  Follow with PCP in 2 to 3 days to ensure improvement.     Final diagnoses:  Urinary tract infection with hematuria, site unspecified    ED Discharge Orders          Ordered    cephALEXin  (KEFLEX ) 500 MG capsule  4 times daily,   Status:  Discontinued        02/04/24 0254    cephALEXin  (KEFLEX ) 500 MG capsule  4 times daily        02/04/24 0305               Vince Ainsley, Selinda, MD 02/04/24 [phone]

## 2024-02-05 LAB — URINE CULTURE: Culture: NO GROWTH

## 2024-02-09 LAB — CULTURE, BLOOD (ROUTINE X 2)
Culture: NO GROWTH
Culture: NO GROWTH
Special Requests: ADEQUATE
Special Requests: ADEQUATE
